# Patient Record
Sex: Male | Born: 2000
Health system: Southern US, Community
[De-identification: ages and names within clinical notes are randomized; demographics above are authoritative.]

## PROBLEM LIST (undated history)

## (undated) DIAGNOSIS — K760 Fatty (change of) liver, not elsewhere classified: Secondary | ICD-10-CM

## (undated) DIAGNOSIS — F909 Attention-deficit hyperactivity disorder, unspecified type: Secondary | ICD-10-CM

## (undated) DIAGNOSIS — F32A Depression, unspecified: Secondary | ICD-10-CM

## (undated) DIAGNOSIS — J45909 Unspecified asthma, uncomplicated: Secondary | ICD-10-CM

## (undated) DIAGNOSIS — F329 Major depressive disorder, single episode, unspecified: Secondary | ICD-10-CM

## (undated) HISTORY — DX: Fatty (change of) liver, not elsewhere classified: K76.0

## (undated) HISTORY — DX: Attention-deficit hyperactivity disorder, unspecified type: F90.9

## (undated) HISTORY — PX: TONSILLECTOMY: SUR1361

## (undated) HISTORY — DX: Unspecified asthma, uncomplicated: J45.909

---
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
## 2018-01-20 ENCOUNTER — Encounter: Payer: Self-pay | Admitting: Adult Health

## 2018-01-20 ENCOUNTER — Ambulatory Visit: Payer: BLUE CROSS/BLUE SHIELD | Admitting: Adult Health

## 2018-01-20 ENCOUNTER — Other Ambulatory Visit (HOSPITAL_COMMUNITY)
Admission: RE | Admit: 2018-01-20 | Discharge: 2018-01-20 | Disposition: A | Discharge status: Home / Self Care | Payer: BLUE CROSS/BLUE SHIELD | Source: Ambulatory Visit | Attending: Adult Health | Admitting: Adult Health

## 2018-01-20 VITALS — BP 92/50 | Temp 98.8°F | Wt 241.0 lb

## 2018-01-20 DIAGNOSIS — Z7689 Persons encountering health services in other specified circumstances: Secondary | ICD-10-CM | POA: Diagnosis not present

## 2018-01-20 DIAGNOSIS — R361 Hematospermia: Secondary | ICD-10-CM | POA: Diagnosis not present

## 2018-01-20 NOTE — Patient Instructions (Signed)
It was great meeting you today   I will follow up with you regarding your blood work   Please start dieting and exercising.

## 2018-01-20 NOTE — Progress Notes (Signed)
Patient presents to clinic today to establish care. He is a pleasant 17 year old male who  has a past medical history of ADHD, Asthma, and Depression.   Acute Concerns: Establish Care   Chronic Issues:  Asthma - as a young child. Not currently using an inhaler.   Depression - denies depression at this time   Blood in Semen - reports that this started two years ago. Reports trace amount with a " tint of red". Denies any burning with urination or UTI like symptoms. Denies being sexually active at this time but has been in the past ( used condoms), his symptoms started prior to sex. . Denies fevers, chills, or feeling acutely ill. Is not having any rectal pain or pain with bowel movement. Does report that he was seen by urology at some point for this issue and they prescribed him an antibiotic, this did not resolve the issue.   Health Maintenance: Dental --Yearly  Vision -- Yearly.  Immunizations -- UTD  Colonoscopy -- Never had Diet: He tries to eat healthy.  Exercise: Does not exercise on a regular basis    Past Medical History:  Diagnosis Date  . ADHD   . Asthma   . Depression     Past Surgical History:  Procedure Laterality Date  . TONSILLECTOMY      Current Outpatient Medications on File Prior to Visit  Medication Sig Dispense Refill  . benzonatate (TESSALON) 100 MG capsule TAKE ONE CAPSULE BY MOUTH TWICE A DAY AS NEEDED FOR COUGH  0   No current facility-administered medications on file prior to visit.     No Known Allergies  Family History  Problem Relation Age of Onset  . Heart disease Mother   . Diabetes Father   . Cancer Maternal Grandfather        prostate cancer?   . Diabetes Paternal Grandmother     Social History   Socioeconomic History  . Marital status: Single    Spouse name: Not on file  . Number of children: Not on file  . Years of education: Not on file  . Highest education level: Not on file  Occupational History  . Not on file    Social Needs  . Financial resource strain: Not on file  . Food insecurity:    Worry: Not on file    Inability: Not on file  . Transportation needs:    Medical: Not on file    Non-medical: Not on file  Tobacco Use  . Smoking status: Never Smoker  . Smokeless tobacco: Never Used  Substance and Sexual Activity  . Alcohol use: No  . Drug use: No  . Sexual activity: Not Currently  Lifestyle  . Physical activity:    Days per week: Not on file    Minutes per session: Not on file  . Stress: Not on file  Relationships  . Social connections:    Talks on phone: Not on file    Gets together: Not on file    Attends religious service: Not on file    Active member of club or organization: Not on file    Attends meetings of clubs or organizations: Not on file    Relationship status: Not on file  . Intimate partner violence:    Fear of current or ex partner: Not on file    Emotionally abused: Not on file    Physically abused: Not on file    Forced sexual activity: Not on file  Other Topics Concern  . Not on file  Social History Narrative   Getting associated degree at [employer]       He enjoys writing    Review of Systems  Constitutional: Negative.   Respiratory: Negative.   Cardiovascular: Negative.   Genitourinary: Negative.        Blood in semen    Musculoskeletal: Negative.   Neurological: Negative.   Psychiatric/Behavioral: Negative.   All other systems reviewed and are negative.   BP (!) 92/50   Temp 98.8 F (37.1 C)   Wt 241 lb (109.3 kg)   Physical Exam  Constitutional: He is oriented to person, place, and time. He appears well-developed and well-nourished. No distress.  Cardiovascular: Normal rate, regular rhythm, normal heart sounds and intact distal pulses.  Pulmonary/Chest: Effort normal and breath sounds normal.  Abdominal: Hernia confirmed negative in the right inguinal area and confirmed negative in the left inguinal area.  Genitourinary: Testes  normal and penis normal. Cremasteric reflex is present. Right testis shows no mass, no swelling and no tenderness. Left testis shows no mass, no swelling and no tenderness. No penile tenderness.  Musculoskeletal: Normal range of motion.  Neurological: He is alert and oriented to person, place, and time.  Skin: Skin is warm and dry. He is not diaphoretic.  Psychiatric: He has a normal mood and affect. His behavior is normal. Judgment and thought content normal.  Nursing note and vitals reviewed.   No results found for this or any previous visit (from the past 2160 hour(s)).  Assessment/Plan: 1. Encounter to establish care - Follow up as needed - Encouraged weight loss through diet and exercise   2. Hematospermia - POC Urinalysis Dipstick - Urine cytology ancillary only - Urine Culture  Shirline Frees, NP

## 2018-01-21 ENCOUNTER — Encounter: Payer: Self-pay | Admitting: Family Medicine

## 2018-01-21 LAB — POCT URINALYSIS DIPSTICK
Bilirubin, UA: NEGATIVE
Blood, UA: NEGATIVE
GLUCOSE UA: NEGATIVE
KETONES UA: NEGATIVE
Leukocytes, UA: NEGATIVE
Nitrite, UA: NEGATIVE
Protein, UA: NEGATIVE
SPEC GRAV UA: 1.025 (ref 1.010–1.025)
Urobilinogen, UA: 0.2 E.U./dL
pH, UA: 6 (ref 5.0–8.0)

## 2018-01-22 LAB — URINE CYTOLOGY ANCILLARY ONLY
Chlamydia: NEGATIVE
Neisseria Gonorrhea: NEGATIVE
TRICH (WINDOWPATH): NEGATIVE

## 2018-01-22 LAB — URINE CULTURE
MICRO NUMBER: 90934706
RESULT: NO GROWTH
SPECIMEN QUALITY:: ADEQUATE

## 2018-06-29 ENCOUNTER — Ambulatory Visit: Payer: BLUE CROSS/BLUE SHIELD | Admitting: Internal Medicine

## 2018-06-29 ENCOUNTER — Encounter: Payer: Self-pay | Admitting: Internal Medicine

## 2018-06-29 VITALS — BP 112/68 | HR 116 | Temp 101.7°F | Wt 245.9 lb

## 2018-06-29 DIAGNOSIS — R6889 Other general symptoms and signs: Secondary | ICD-10-CM

## 2018-06-29 DIAGNOSIS — J989 Respiratory disorder, unspecified: Secondary | ICD-10-CM

## 2018-06-29 DIAGNOSIS — R509 Fever, unspecified: Secondary | ICD-10-CM

## 2018-06-29 LAB — POC INFLUENZA A&B (BINAX/QUICKVUE)
INFLUENZA A, POC: NEGATIVE
Influenza B, POC: NEGATIVE

## 2018-06-29 NOTE — Patient Instructions (Addendum)
You exam is consistent  with  Influenza tyupe virus infection that can cause cough hoarseness and fever .  Even if not the influenza there are other viruses that  Cause the same symptoms as flu.   Rest fluids and if fever not gone   In 2 days or not improving in the next 5-7 days then advise recheck.   No signs of pneumonia  On exam today .  But if   Fever Persists we may get a chest x ray  tylenol  or ibuprofen for fever and pain   Gargles and  Push fluids .    You ears are not infected but hurt  Referred pain from the congestion.   No school until no fever for 24 hours and feeling up to it.    Influenza, Adult Influenza, more commonly known as "the flu," is a viral infection that mainly affects the respiratory tract. The respiratory tract includes organs that help you breathe, such as the lungs, nose, and throat. The flu causes many symptoms similar to the common cold along with high fever and body aches. The flu spreads easily from person to person (is contagious). Getting a flu shot (influenza vaccination) every year is the best way to prevent the flu. What are the causes? This condition is caused by the influenza virus. You can get the virus by:  Breathing in droplets that are in the air from an infected person's cough or sneeze.  Touching something that has been exposed to the virus (has been contaminated) and then touching your mouth, nose, or eyes. What increases the risk? The following factors may make you more likely to get the flu:  Not washing or sanitizing your hands often.  Having close contact with many people during cold and flu season.  Touching your mouth, eyes, or nose without first washing or sanitizing your hands.  Not getting a yearly (annual) flu shot. You may have a higher risk for the flu, including serious problems such as a lung infection (pneumonia), if you:  Are older than 65.  Are pregnant.  Have a weakened disease-fighting system (immune system).  You may have a weakened immune system if you: ? Have HIV or AIDS. ? Are undergoing chemotherapy. ? Are taking medicines that reduce (suppress) the activity of your immune system.  Have a long-term (chronic) illness, such as heart disease, kidney disease, diabetes, or lung disease.  Have a liver disorder.  Are severely overweight (morbidly obese).  Have anemia. This is a condition that affects your red blood cells.  Have asthma. What are the signs or symptoms? Symptoms of this condition usually begin suddenly and last 4-14 days. They may include:  Fever and chills.  Headaches, body aches, or muscle aches.  Sore throat.  Cough.  Runny or stuffy (congested) nose.  Chest discomfort.  Poor appetite.  Weakness or fatigue.  Dizziness.  Nausea or vomiting. How is this diagnosed? This condition may be diagnosed based on:  Your symptoms and medical history.  A physical exam.  Swabbing your nose or throat and testing the fluid for the influenza virus. How is this treated? If the flu is diagnosed early, you can be treated with medicine that can help reduce how severe the illness is and how long it lasts (antiviral medicine). This may be given by mouth (orally) or through an IV. Taking care of yourself at home can help relieve symptoms. Your health care provider may recommend:  Taking over-the-counter medicines.  Drinking plenty of  fluids. In many cases, the flu goes away on its own. If you have severe symptoms or complications, you may be treated in a hospital. Follow these instructions at home: Activity  Rest as needed and get plenty of sleep.  Stay home from work or school as told by your health care provider. Unless you are visiting your health care provider, avoid leaving home until your fever has been gone for 24 hours without taking medicine. Eating and drinking  Take an oral rehydration solution (ORS). This is a drink that is sold at pharmacies and retail  stores.  Drink enough fluid to keep your urine pale yellow.  Drink clear fluids in small amounts as you are able. Clear fluids include water, ice chips, diluted fruit juice, and low-calorie sports drinks.  Eat bland, easy-to-digest foods in small amounts as you are able. These foods include bananas, applesauce, rice, lean meats, toast, and crackers.  Avoid drinking fluids that contain a lot of sugar or caffeine, such as energy drinks, regular sports drinks, and soda.  Avoid alcohol.  Avoid spicy or fatty foods. General instructions      Take over-the-counter and prescription medicines only as told by your health care provider.  Use a cool mist humidifier to add humidity to the air in your home. This can make it easier to breathe.  Cover your mouth and nose when you cough or sneeze.  Wash your hands with soap and water often, especially after you cough or sneeze. If soap and water are not available, use alcohol-based hand sanitizer.  Keep all follow-up visits as told by your health care provider. This is important. How is this prevented?   Get an annual flu shot. You may get the flu shot in late summer, fall, or winter. Ask your health care provider when you should get your flu shot.  Avoid contact with people who are sick during cold and flu season. This is generally fall and winter. Contact a health care provider if:  You develop new symptoms.  You have: ? Chest pain. ? Diarrhea. ? A fever.  Your cough gets worse.  You produce more mucus.  You feel nauseous or you vomit. Get help right away if:  You develop shortness of breath or difficulty breathing.  Your skin or nails turn a bluish color.  You have severe pain or stiffness in your neck.  You develop a sudden headache or sudden pain in your face or ear.  You cannot eat or drink without vomiting. Summary  Influenza, more commonly known as "the flu," is a viral infection that primarily affects your  respiratory tract.  Symptoms of the flu usually begin suddenly and last 4-14 days.  Getting an annual flu shot is the best way to prevent getting the flu.  Stay home from work or school as told by your health care provider. Unless you are visiting your health care provider, avoid leaving home until your fever has been gone for 24 hours without taking medicine.  Keep all follow-up visits as told by your health care provider. This is important. This information is not intended to replace advice given to you by your health care provider. Make sure you discuss any questions you have with your health care provider. Document Released: 05/31/2000 Document Revised: 11/19/2017 Document Reviewed: 11/19/2017 Elsevier Interactive Patient Education  2019 [employer].

## 2018-06-29 NOTE — Progress Notes (Signed)
Chief Complaint  Patient presents with  . Sore Throat    x5 days pt has voice loss, has body aches, and has tightness in throat     HPI: Joshua BryantMohammed Tarek Richardson 18 y.o.   Comes in for sda   PCP NA today   Sx for 5 days of upper resp sx  fever for the last 2-3  days   Cough sore throat laryngitis   Nausea ear pain congestion.  No vomiting hard to talk and has ear pain  Mom had bronchitis in recent past .  No flu vaccine this season.  ROS: See pertinent positives and negatives per HPI. No hx of underlying resp disease   Past Medical History:  Diagnosis Date  . ADHD   . Asthma   . Depression   . Fatty liver     Family History  Problem Relation Age of Onset  . Heart disease Mother   . Heart attack Mother   . Diabetes Father   . Cancer Maternal Grandfather        prostate cancer?   . Diabetes Paternal Grandmother     Social History   Socioeconomic History  . Marital status: Single    Spouse name: Not on file  . Number of children: Not on file  . Years of education: Not on file  . Highest education level: Not on file  Occupational History  . Not on file  Social Needs  . Financial resource strain: Not on file  . Food insecurity:    Worry: Not on file    Inability: Not on file  . Transportation needs:    Medical: Not on file    Non-medical: Not on file  Tobacco Use  . Smoking status: Never Smoker  . Smokeless tobacco: Never Used  Substance and Sexual Activity  . Alcohol use: No  . Drug use: No  . Sexual activity: Not Currently  Lifestyle  . Physical activity:    Days per week: Not on file    Minutes per session: Not on file  . Stress: Not on file  Relationships  . Social connections:    Talks on phone: Not on file    Gets together: Not on file    Attends religious service: Not on file    Active member of club or organization: Not on file    Attends meetings of clubs or organizations: Not on file    Relationship status: Not on file  Other Topics  Concern  . Not on file  Social History Narrative   Getting associated degree at [employer]       He enjoys writing    Outpatient Medications Prior to Visit  Medication Sig Dispense Refill  . benzonatate (TESSALON) 100 MG capsule TAKE ONE CAPSULE BY MOUTH TWICE A DAY AS NEEDED FOR COUGH  0   No facility-administered medications prior to visit.      EXAM:  BP 112/68 (BP Location: Right Arm, Patient Position: Sitting, Cuff Size: Normal)   Pulse (!) 116   Temp (!) 101.7 F (38.7 C) (Oral)   Wt 245 lb 14.4 oz (111.5 kg)   SpO2 99%   There is no height or weight on file to calculate BMI. WDWN in NAD  quiet respirations; mildly congested   hoarse. Non toxic . But sick  Dec voice  HEENT: Normocephalic ;atraumatic , Eyes;  PERRL, EOMs  Full, lids and conjunctiva clear,,Ears: no deformities, canals nl, TM landmarks normal, Nose: no deformity or  discharge but congested;face non  tender Mouth : OP clear without lesion or edema . 1+ red   couging  When opened mouth  Neck: Supple without adenopathy or masses or bruits Chest:  Clear to Awithout wheezes rales or rhonchi CV:  S1-S2 no gallops or murmurs peripheral perfusion is normal Skin :nl perfusion and no acute rashes   Deep bronchial cough  At times  ASSESSMENT AND PLAN:  Discussed the following assessment and plan:  Flu-like symptoms - Plan: POC Influenza A&B (Binax test)  Respiratory illness with fever Fever and resp illness  No obv comlication.  FU advised if persisteing fever etc  Alarm sx   Counseled.  -Patient advised to return or notify health care team  if symptoms worsen ,persist or new concerns arise.  Patient Instructions   You exam is consistent  with  Influenza tyupe virus infection that can cause cough hoarseness and fever .  Even if not the influenza there are other viruses that  Cause the same symptoms as flu.   Rest fluids and if fever not gone   In 2 days or not improving in the next 5-7 days then advise  recheck.   No signs of pneumonia  On exam today .  But if   Fever Persists we may get a chest x ray  tylenol  or ibuprofen for fever and pain   Gargles and  Push fluids .    You ears are not infected but hurt  Referred pain from the congestion.   No school until no fever for 24 hours and feeling up to it.    Influenza, Adult Influenza, more commonly known as "the flu," is a viral infection that mainly affects the respiratory tract. The respiratory tract includes organs that help you breathe, such as the lungs, nose, and throat. The flu causes many symptoms similar to the common cold along with high fever and body aches. The flu spreads easily from person to person (is contagious). Getting a flu shot (influenza vaccination) every year is the best way to prevent the flu. What are the causes? This condition is caused by the influenza virus. You can get the virus by:  Breathing in droplets that are in the air from an infected person's cough or sneeze.  Touching something that has been exposed to the virus (has been contaminated) and then touching your mouth, nose, or eyes. What increases the risk? The following factors may make you more likely to get the flu:  Not washing or sanitizing your hands often.  Having close contact with many people during cold and flu season.  Touching your mouth, eyes, or nose without first washing or sanitizing your hands.  Not getting a yearly (annual) flu shot. You may have a higher risk for the flu, including serious problems such as a lung infection (pneumonia), if you:  Are older than 65.  Are pregnant.  Have a weakened disease-fighting system (immune system). You may have a weakened immune system if you: ? Have HIV or AIDS. ? Are undergoing chemotherapy. ? Are taking medicines that reduce (suppress) the activity of your immune system.  Have a long-term (chronic) illness, such as heart disease, kidney disease, diabetes, or lung disease.  Have a  liver disorder.  Are severely overweight (morbidly obese).  Have anemia. This is a condition that affects your red blood cells.  Have asthma. What are the signs or symptoms? Symptoms of this condition usually begin suddenly and last 4-14 days. They may include:  Fever and chills.  Headaches, body aches, or muscle aches.  Sore throat.  Cough.  Runny or stuffy (congested) nose.  Chest discomfort.  Poor appetite.  Weakness or fatigue.  Dizziness.  Nausea or vomiting. How is this diagnosed? This condition may be diagnosed based on:  Your symptoms and medical history.  A physical exam.  Swabbing your nose or throat and testing the fluid for the influenza virus. How is this treated? If the flu is diagnosed early, you can be treated with medicine that can help reduce how severe the illness is and how long it lasts (antiviral medicine). This may be given by mouth (orally) or through an IV. Taking care of yourself at home can help relieve symptoms. Your health care provider may recommend:  Taking over-the-counter medicines.  Drinking plenty of fluids. In many cases, the flu goes away on its own. If you have severe symptoms or complications, you may be treated in a hospital. Follow these instructions at home: Activity  Rest as needed and get plenty of sleep.  Stay home from work or school as told by your health care provider. Unless you are visiting your health care provider, avoid leaving home until your fever has been gone for 24 hours without taking medicine. Eating and drinking  Take an oral rehydration solution (ORS). This is a drink that is sold at pharmacies and retail stores.  Drink enough fluid to keep your urine pale yellow.  Drink clear fluids in small amounts as you are able. Clear fluids include water, ice chips, diluted fruit juice, and low-calorie sports drinks.  Eat bland, easy-to-digest foods in small amounts as you are able. These foods include  bananas, applesauce, rice, lean meats, toast, and crackers.  Avoid drinking fluids that contain a lot of sugar or caffeine, such as energy drinks, regular sports drinks, and soda.  Avoid alcohol.  Avoid spicy or fatty foods. General instructions      Take over-the-counter and prescription medicines only as told by your health care provider.  Use a cool mist humidifier to add humidity to the air in your home. This can make it easier to breathe.  Cover your mouth and nose when you cough or sneeze.  Wash your hands with soap and water often, especially after you cough or sneeze. If soap and water are not available, use alcohol-based hand sanitizer.  Keep all follow-up visits as told by your health care provider. This is important. How is this prevented?   Get an annual flu shot. You may get the flu shot in late summer, fall, or winter. Ask your health care provider when you should get your flu shot.  Avoid contact with people who are sick during cold and flu season. This is generally fall and winter. Contact a health care provider if:  You develop new symptoms.  You have: ? Chest pain. ? Diarrhea. ? A fever.  Your cough gets worse.  You produce more mucus.  You feel nauseous or you vomit. Get help right away if:  You develop shortness of breath or difficulty breathing.  Your skin or nails turn a bluish color.  You have severe pain or stiffness in your neck.  You develop a sudden headache or sudden pain in your face or ear.  You cannot eat or drink without vomiting. Summary  Influenza, more commonly known as "the flu," is a viral infection that primarily affects your respiratory tract.  Symptoms of the flu usually begin suddenly and last 4-14 days.  Getting an annual flu shot is the best way to prevent getting the flu.  Stay home from work or school as told by your health care provider. Unless you are visiting your health care provider, avoid leaving home until  your fever has been gone for 24 hours without taking medicine.  Keep all follow-up visits as told by your health care provider. This is important. This information is not intended to replace advice given to you by your health care provider. Make sure you discuss any questions you have with your health care provider. Document Released: 05/31/2000 Document Revised: 11/19/2017 Document Reviewed: 11/19/2017 Elsevier Interactive Patient Education  2019 [employer]            [place]ittierWanda K. Defne Gerling M.D.

## 2018-12-31 ENCOUNTER — Ambulatory Visit: Payer: BC Managed Care – PPO | Admitting: Adult Health

## 2018-12-31 ENCOUNTER — Telehealth: Payer: Self-pay | Admitting: *Deleted

## 2018-12-31 NOTE — Telephone Encounter (Signed)
Copied from [id number]. Topic: Appointment Scheduling - Scheduling Inquiry for Clinic >> Dec 30, 2018  4:03 PM Nils Flack wrote: Reason for CRM: pt would like appt for ongoing cough for 2 weeks.  It happened after he inhaled powder. No answer at office

## 2018-12-31 NOTE — Telephone Encounter (Signed)
Pt scheduled to see Tommi Rumps today at 3 PM.

## 2019-06-24 IMAGING — US US ABDOMEN COMPLETE
1 series · 14 of 25 positions shown · non-contrast
Comparison: None.

CLINICAL DATA: Elevated liver enzymes.

EXAM:
ABDOMEN ULTRASOUND COMPLETE

[Series 1: us abdomen complete · 0.25mm/px · 14 of 109 slices shown]
[im 1/109]
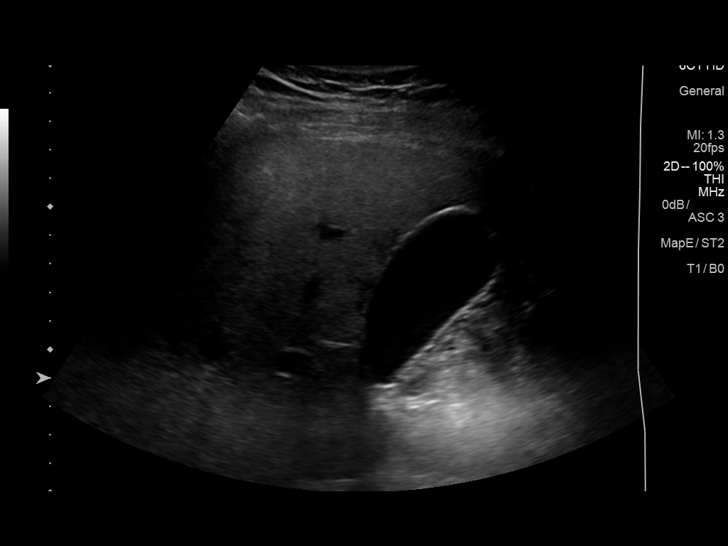
[im 10/109]
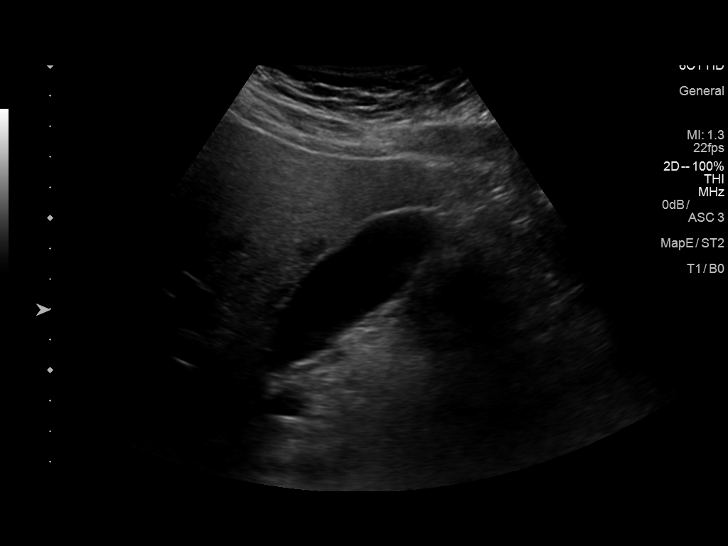
[im 19/109]
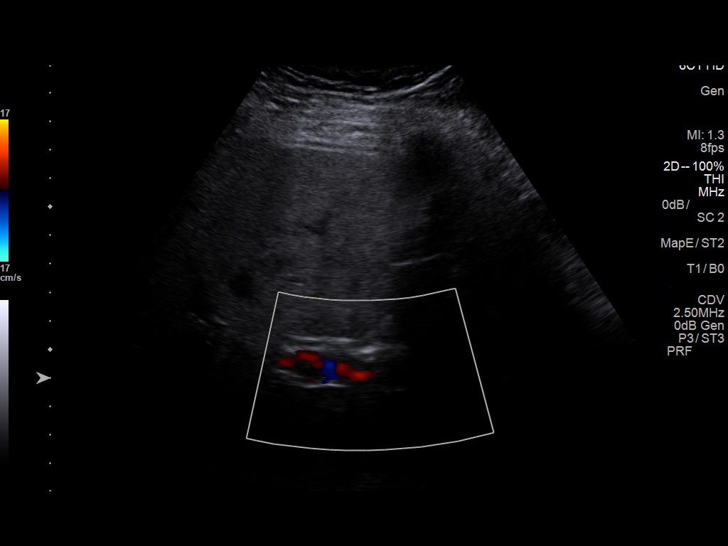
[im 28/109]
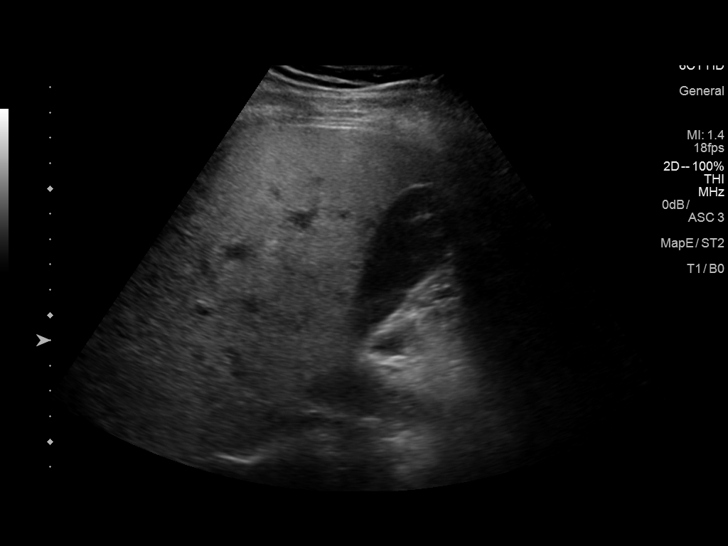
[im 37/109]
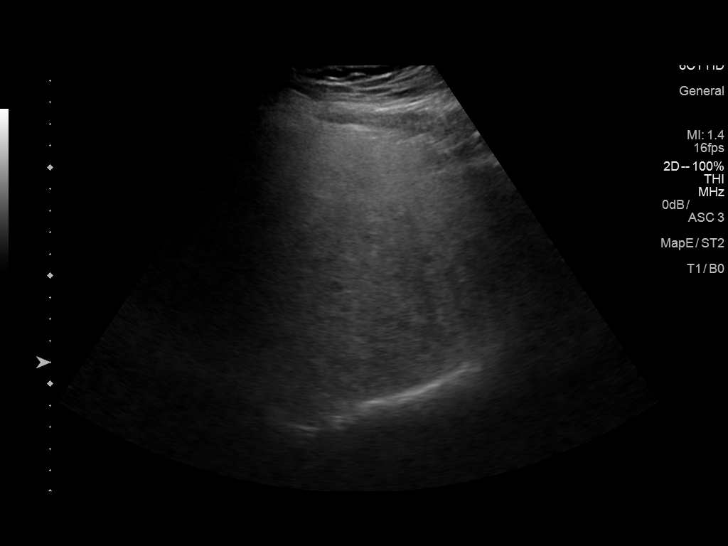
[im 41/109]
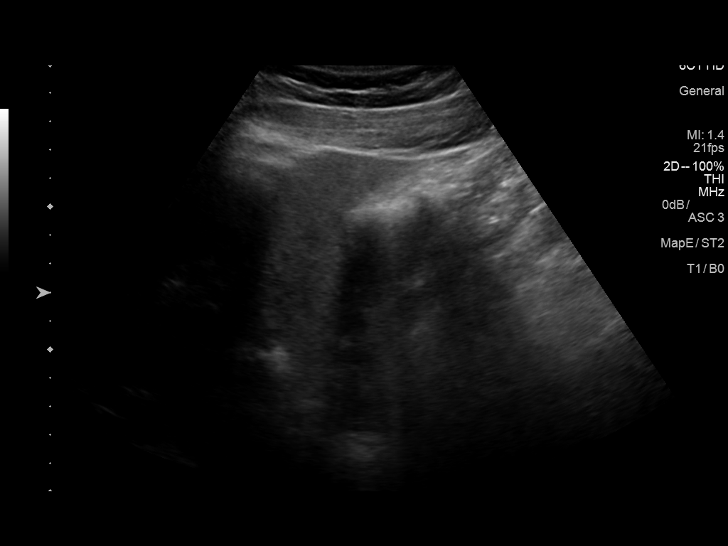
[im 50/109]
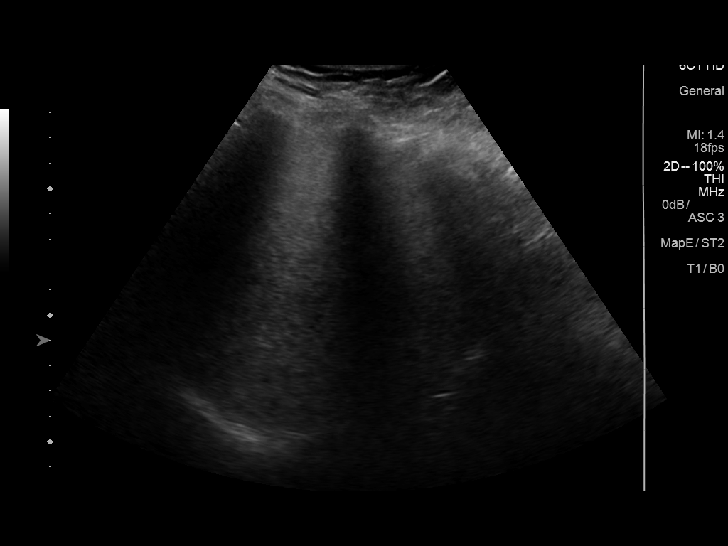
[im 59/109]
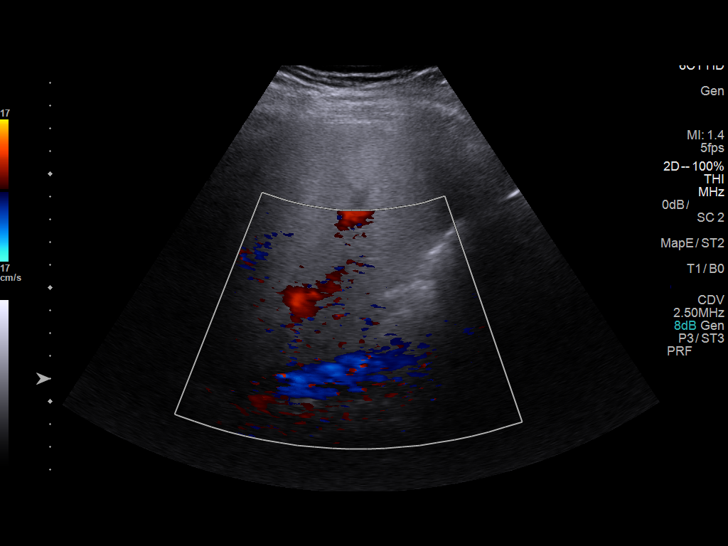
[im 68/109]
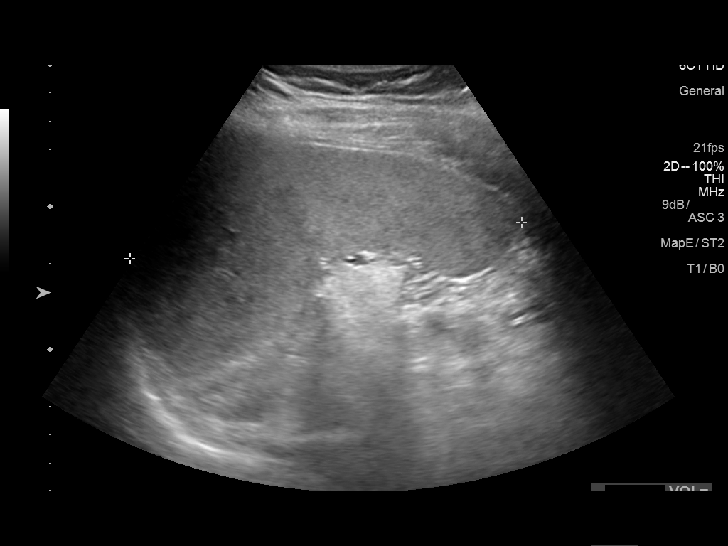
[im 73/109]
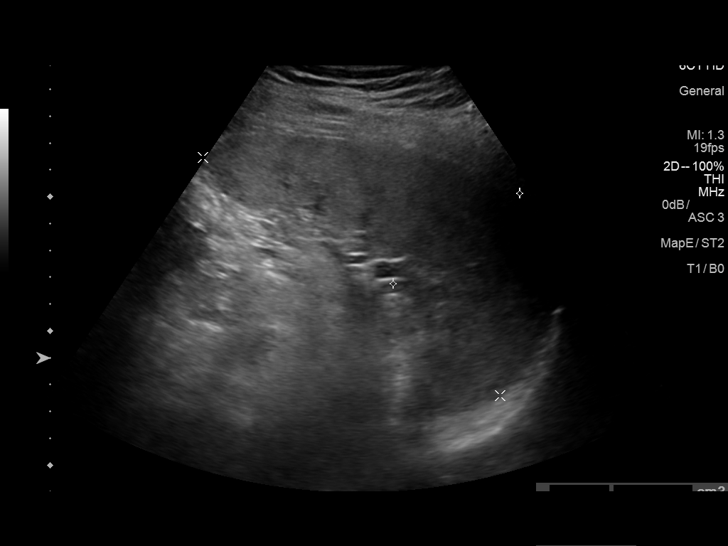
[im 82/109]
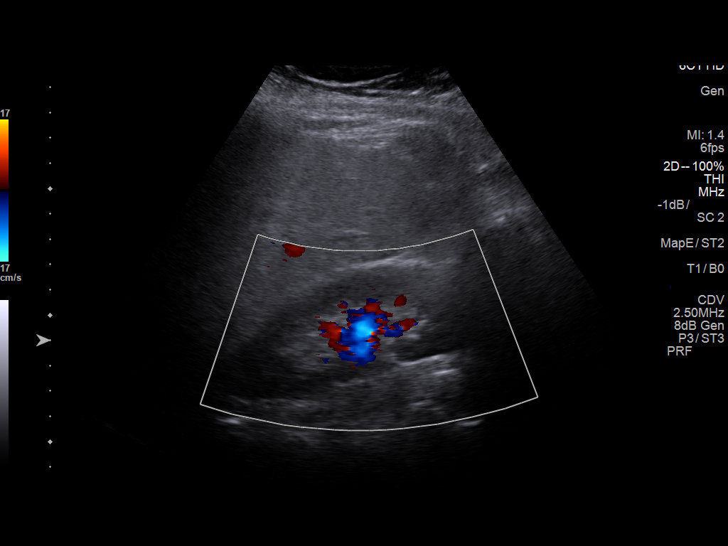
[im 91/109]
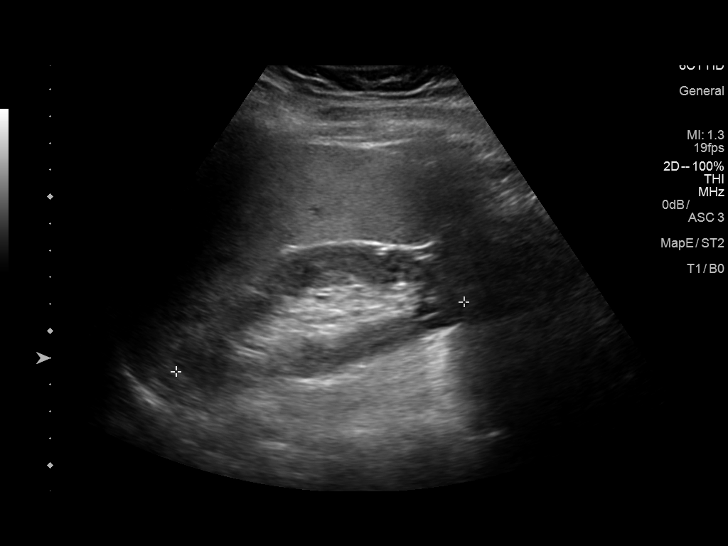
[im 100/109]
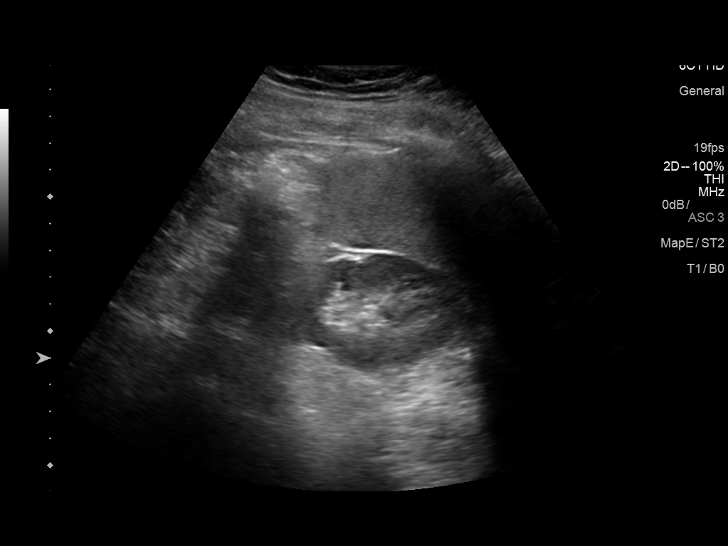
[im 109/109]
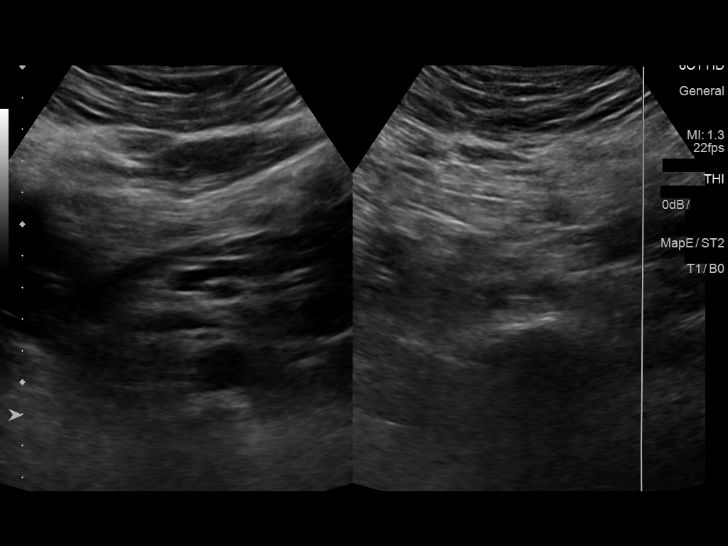

[14 of 25 positions shown; findings below may reference images not displayed]

FINDINGS: Gallbladder: No gallstones or wall thickening visualized. No
sonographic Murphy sign noted by sonographer.

Common bile duct: Diameter: 3 mm common normal

Liver: Slightly increased echogenicity suggesting fatty change.
Probable region of focal sparing adjacent to the gallbladder fossa
of. Portal vein is patent on color Doppler imaging with normal
direction of blood flow towards the liver.

IVC: No abnormality visualized.

Pancreas: Poorly seen because of overlying bowel gas.

Spleen: Length 13.8 cm.  Volume 593. 2 cc.

Right Kidney: Length: 11.8 cm. Echogenicity within normal limits. No
mass or hydronephrosis visualized.

Left Kidney: Length: 11.1 cm. Echogenicity within normal limits. No
mass or hydronephrosis visualized.

Abdominal aorta: No aneurysm visualized.

Other findings: No ascites
IMPRESSION: Fatty liver.  Mild splenomegaly.

## 2020-11-16 DIAGNOSIS — Z20822 Contact with and (suspected) exposure to covid-19: Secondary | ICD-10-CM | POA: Diagnosis not present

## 2021-09-14 ENCOUNTER — Telehealth: Payer: Self-pay

## 2021-09-14 NOTE — Telephone Encounter (Signed)
Last OV 01/30/18. ? ?LVM on mother's voicemail asking pt to call office to determine if he still wanted [facility] as PCP. ?
